# Patient Record
Sex: Female | Born: 1970 | Race: White | Hispanic: No | Marital: Married | Smoking: Former smoker
Health system: Southern US, Community
[De-identification: ages and names within clinical notes are randomized; demographics above are authoritative.]

## PROBLEM LIST (undated history)

## (undated) DIAGNOSIS — Z889 Allergy status to unspecified drugs, medicaments and biological substances status: Secondary | ICD-10-CM

## (undated) DIAGNOSIS — J45909 Unspecified asthma, uncomplicated: Secondary | ICD-10-CM

## (undated) DIAGNOSIS — T8859XA Other complications of anesthesia, initial encounter: Secondary | ICD-10-CM

## (undated) DIAGNOSIS — R87619 Unspecified abnormal cytological findings in specimens from cervix uteri: Secondary | ICD-10-CM

## (undated) HISTORY — PX: TONSILLECTOMY: SUR1361

## (undated) HISTORY — PX: SINUS SURGERY WITH INSTATRAK: SHX5215

## (undated) HISTORY — PX: COLPOSCOPY: SHX161

## (undated) HISTORY — PX: CHOLECYSTECTOMY: SHX55

## (undated) HISTORY — DX: Unspecified abnormal cytological findings in specimens from cervix uteri: R87.619

---

## 2014-06-14 ENCOUNTER — Ambulatory Visit: Payer: Self-pay

## 2014-09-06 ENCOUNTER — Ambulatory Visit: Payer: Self-pay | Admitting: Physician Assistant

## 2014-09-28 DIAGNOSIS — Z803 Family history of malignant neoplasm of breast: Secondary | ICD-10-CM | POA: Insufficient documentation

## 2014-09-28 DIAGNOSIS — J302 Other seasonal allergic rhinitis: Secondary | ICD-10-CM | POA: Insufficient documentation

## 2014-09-28 DIAGNOSIS — J45909 Unspecified asthma, uncomplicated: Secondary | ICD-10-CM | POA: Insufficient documentation

## 2014-10-12 ENCOUNTER — Ambulatory Visit: Payer: Self-pay | Admitting: Family Medicine

## 2014-12-11 ENCOUNTER — Ambulatory Visit: Payer: Self-pay | Admitting: Family Medicine

## 2015-05-02 ENCOUNTER — Ambulatory Visit
Admission: EM | Admit: 2015-05-02 | Discharge: 2015-05-02 | Disposition: A | Discharge status: Home / Self Care | Payer: Commercial Indemnity | Attending: Internal Medicine | Admitting: Internal Medicine

## 2015-05-02 DIAGNOSIS — J321 Chronic frontal sinusitis: Secondary | ICD-10-CM

## 2015-05-02 HISTORY — DX: Unspecified asthma, uncomplicated: J45.909

## 2015-05-02 MED ORDER — AMOXICILLIN-POT CLAVULANATE 875-125 MG PO TABS: 1.0000 | ORAL_TABLET | Freq: Two times a day (BID) | ORAL | Status: DC

## 2015-05-02 NOTE — Discharge Instructions (Signed)

## 2015-05-02 NOTE — ED Notes (Signed)
Started Monday with sinus congestion. + pain and pressure behind eyes. + left ear pain.

## 2015-05-02 NOTE — ED Provider Notes (Signed)
CSN: [id number]     Arrival date & time 05/02/15  1922 History   First MD Initiated Contact with Patient 05/02/15 2000     Chief Complaint  Patient presents with  . Sinusitis   (Consider location/radiation/quality/duration/timing/severity/associated sxs/prior Treatment) HPI   Is a 44 year old female who presents with chronic recurrent sinusitis. She is living in a new house that is currently having great deal of construction around her causing dust which is exacerbated her symptoms. For the last 4 days she had a terrible sinus headache with sinus pressure and drainage. Now her right ear has begun to hurt as well in the care of an allergist provides injection. Recently she had a CT scan which showed a deep infection of her sinuses. From December to February she was under the care of several physiciansfor antibiotic treatment which finally cleared but has since returned. She's not had fever or chills but does have severe postnasal drip and a cough    Past Medical History  Diagnosis Date  . Asthma    Past Surgical History  Procedure Laterality Date  . Sinus surgery with instatrak     History reviewed. No pertinent family history. History  Substance Use Topics  . Smoking status: Former Research scientist (life sciences)  . Smokeless tobacco: Not on file  . Alcohol Use: Yes     Comment: rarely   OB History    No data available     Review of Systems  HENT: Positive for congestion, ear pain, postnasal drip, rhinorrhea, sinus pressure and sore throat.   Respiratory: Positive for cough and shortness of breath.   All other systems reviewed and are negative.   Allergies  Venomil wasp venom and Ether  Home Medications   Prior to Admission medications   Medication Sig Start Date End Date Taking? Authorizing Provider  fluticasone (FLONASE) 50 MCG/ACT nasal spray Place 1 spray into both nostrils daily.   Yes Historical Provider, MD  fluticasone (FLOVENT HFA) 110 MCG/ACT inhaler Inhale 2 puffs into the lungs 2  (two) times daily.   Yes Historical Provider, MD  amoxicillin-clavulanate (AUGMENTIN) 875-125 MG per tablet Take 1 tablet by mouth every 12 (twelve) hours. 05/02/15   Lorin Picket, PA-C   BP 109/84 mmHg  Pulse 99  Temp(Src) 98.2 F (36.8 C) (Oral)  Resp 16  Ht 5\' 8"  (1.727 m)  Wt 320 lb (145.151 kg)  BMI 48.67 kg/m2  SpO2 99%  LMP 04/25/2015 (Approximate) Physical Exam  Constitutional: She is oriented to person, place, and time. She appears well-developed and well-nourished.  HENT:  Head: Normocephalic and atraumatic.  CM show a effusion. There is tenderness to caution over the frontal sinuses more than the maxillary. Pharynx is benign there is no anterior cervical adenopathy present  Eyes: EOM are normal. Pupils are equal, round, and reactive to light. Right eye exhibits no discharge. Left eye exhibits no discharge.  Neck: Normal range of motion. Neck supple.  Pulmonary/Chest: Breath sounds normal. She is in respiratory distress. She has no wheezes. She has no rales. She exhibits no tenderness.  Musculoskeletal: Normal range of motion.  Lymphadenopathy:    She has no cervical adenopathy.  Neurological: She is alert and oriented to person, place, and time. She has normal reflexes.  Skin: Skin is warm and dry.  Psychiatric: She has a normal mood and affect. Her behavior is normal. Judgment and thought content normal.    ED Course  Procedures (including critical care time) Labs Review Labs Reviewed - No data to  display  Imaging Review No results found.   MDM   1. Chronic frontal sinusitis    New Prescriptions   AMOXICILLIN-CLAVULANATE (AUGMENTIN) 875-125 MG PER TABLET    Take 1 tablet by mouth every 12 (twelve) hours.  Plan: 1.  diagnosis reviewed with patient 2. rx as per orders; risks, benefits, potential side effects reviewed with patient 3. Recommend supportive treatment with rest, Netti pot, fluids, flonase 4. F/u prn if symptoms worsen or don't  improve     Lorin Picket, PA-C 05/02/15 2024

## 2015-10-10 ENCOUNTER — Encounter: Payer: Self-pay | Admitting: Emergency Medicine

## 2015-10-10 ENCOUNTER — Ambulatory Visit
Admission: EM | Admit: 2015-10-10 | Discharge: 2015-10-10 | Disposition: A | Discharge status: Home / Self Care | Payer: Managed Care, Other (non HMO) | Attending: Family Medicine | Admitting: Family Medicine

## 2015-10-10 DIAGNOSIS — J329 Chronic sinusitis, unspecified: Secondary | ICD-10-CM | POA: Diagnosis not present

## 2015-10-10 HISTORY — DX: Allergy status to unspecified drugs, medicaments and biological substances: Z88.9

## 2015-10-10 MED ORDER — CEFUROXIME AXETIL 250 MG PO TABS: ORAL_TABLET | ORAL | Status: DC

## 2015-10-10 NOTE — ED Provider Notes (Signed)
CSN: [id number]     Arrival date & time 10/10/15  1003 History   First MD Initiated Contact with Patient 10/10/15 1129     Chief Complaint  Patient presents with  . Recurrent Sinusitis  . Hoarse   (Consider location/radiation/quality/duration/timing/severity/associated sxs/prior Treatment) HPI   This a 44 year old female who has a history of recurring chronic sinus infections usually around this time of year which almost always will trigger an asthma attack. She states that she has been receiving allergy injections more allergist which has helped a great deal. However several days ago she began to have symptoms of the sinus infection that actually got better in one day only to return causing her to have a large amount of greenish mucus production this morning but with no asthma currently. She has been using her Flonase on a daily basis as well as Flovent which seems to have held the symptoms. She's had a history of sinus surgery and prior environmental allergies necessitating the allergies injections since being a toddler. She has not been febrile and is not feverish today and her O2 sat of 99% on room air.  Past Medical History  Diagnosis Date  . Asthma   . Multiple allergies     pets, mold, pollen, dust mites etc.   Past Surgical History  Procedure Laterality Date  . Sinus surgery with instatrak    . Cholecystectomy    . Tonsillectomy    . Cesarean section    . Sinus surgery with instatrak     History reviewed. No pertinent family history. Social History  Substance Use Topics  . Smoking status: Former Research scientist (life sciences)  . Smokeless tobacco: None  . Alcohol Use: Yes     Comment: rarely   OB History    No data available     Review of Systems  Constitutional: Negative for fever, chills, diaphoresis, activity change and fatigue.  HENT: Positive for congestion, postnasal drip, rhinorrhea, sinus pressure, sneezing and voice change.   Respiratory: Positive for cough.    Allergic/Immunologic: Positive for environmental allergies and food allergies.  All other systems reviewed and are negative.   Allergies  Venomil wasp venom; Ether; and Pollen extract  Home Medications   Prior to Admission medications   Medication Sig Start Date End Date Taking? Authorizing Provider  amoxicillin-clavulanate (AUGMENTIN) 875-125 MG per tablet Take 1 tablet by mouth every 12 (twelve) hours. 05/02/15   Lorin Picket, PA-C  cefUROXime (CEFTIN) 250 MG tablet Take one tablet (250) mg BID with food. 10/10/15   Lorin Picket, PA-C  fluticasone (FLONASE) 50 MCG/ACT nasal spray Place 1 spray into both nostrils daily.    Historical Provider, MD  fluticasone (FLOVENT HFA) 110 MCG/ACT inhaler Inhale 2 puffs into the lungs 2 (two) times daily.    Historical Provider, MD   Meds Ordered and Administered this Visit  Medications - No data to display  BP 113/96 mmHg  Pulse 64  Temp(Src) 98.9 F (37.2 C) (Oral)  Resp 14  Ht 5\' 8"  (1.727 m)  Wt 310 lb (140.615 kg)  BMI 47.15 kg/m2  SpO2 99% No data found.   Physical Exam  Constitutional: She is oriented to person, place, and time. She appears well-developed and well-nourished. No distress.  HENT:  Head: Normocephalic and atraumatic.  Right Ear: External ear normal.  Left Ear: External ear normal.  Mouth/Throat: Oropharynx is clear and moist.  Eyes: Pupils are equal, round, and reactive to light.  Neck: Neck supple.  Pulmonary/Chest: Breath sounds  normal. No respiratory distress. She has no wheezes. She has no rales.  Musculoskeletal: Normal range of motion. She exhibits no edema or tenderness.  Lymphadenopathy:    She has no cervical adenopathy.  Neurological: She is alert and oriented to person, place, and time.  Skin: Skin is warm and dry. She is not diaphoretic.  Psychiatric: She has a normal mood and affect. Her behavior is normal. Judgment and thought content normal.  Nursing note and vitals reviewed.   ED  Course  Procedures (including critical care time)  Labs Review Labs Reviewed - No data to display  Imaging Review No results found.   Visual Acuity Review  Right Eye Distance:   Left Eye Distance:   Bilateral Distance:    Right Eye Near:   Left Eye Near:    Bilateral Near:         MDM   1. Chronic sinusitis, unspecified location    Discharge Medication List as of 10/10/2015 11:34 AM    START taking these medications   Details  cefUROXime (CEFTIN) 250 MG tablet Take one tablet (250) mg BID with food., Print      Plan: 1. Diagnosis reviewed with patient 2. rx as per orders; risks, benefits, potential side effects reviewed with patient 3. Recommend supportive treatment with rest,flonase, 4. F/u prn if symptoms worsen or don't improve   Told patient that she likely has a large allergy overlay to her symptoms. I've encouraged her to continue with the allergist to continue with her Flovent and Flonase. I will provide her prescription for Ceftin to use only if this continues to bother her for about another week. Not having any symptoms or signs of asthma today. The Flovent is probably helping a great deal in this regard. She should follow-up with her allergist and her primary care physician if she continues to have her symptoms.  Lorin Picket, PA-C 10/10/15 1144

## 2015-10-10 NOTE — ED Notes (Signed)
Pt reports recurrent sinus infection and has triggered her asthma, hoarse voice.

## 2015-10-10 NOTE — Discharge Instructions (Signed)

## 2016-09-14 ENCOUNTER — Ambulatory Visit
Admission: EM | Admit: 2016-09-14 | Discharge: 2016-09-14 | Disposition: A | Discharge status: Home / Self Care | Payer: Managed Care, Other (non HMO) | Attending: Family Medicine | Admitting: Family Medicine

## 2016-09-14 ENCOUNTER — Encounter: Payer: Self-pay | Admitting: Gynecology

## 2016-09-14 DIAGNOSIS — J0101 Acute recurrent maxillary sinusitis: Secondary | ICD-10-CM

## 2016-09-14 MED ORDER — CEFUROXIME AXETIL 250 MG PO TABS: ORAL_TABLET | ORAL | 0 refills | Status: DC

## 2016-09-14 NOTE — ED Triage Notes (Signed)
Patient c/o sinus problem x 4 days 

## 2016-09-14 NOTE — ED Provider Notes (Signed)
CSN: [id number]     Arrival date & time 09/14/16  1123 History   None    Chief Complaint  Patient presents with  . Facial Pain   (Consider location/radiation/quality/duration/timing/severity/associated sxs/prior Treatment) HPI  45 YO female presents with recurrent sinusitis. Here last year for same. Now with facial pain drainage, And exacerbation of asthma. No fever or chills. Receives allergy shots weekly.   Denies feer or chills.     Past Medical History:  Diagnosis Date  . Asthma   . Multiple allergies    pets, mold, pollen, dust mites etc.   Past Surgical History:  Procedure Laterality Date  . CESAREAN SECTION    . CHOLECYSTECTOMY    . SINUS SURGERY WITH INSTATRAK    . SINUS SURGERY WITH INSTATRAK    . TONSILLECTOMY     No family history on file. Social History  Substance Use Topics  . Smoking status: Former Research scientist (life sciences)  . Smokeless tobacco: Never Used  . Alcohol use Yes     Comment: rarely   OB History    No data available     Review of Systems  Constitutional: Negative for chills, fatigue and fever.  HENT: Positive for congestion, postnasal drip, rhinorrhea and sinus pressure.   Respiratory: Positive for cough.   All other systems reviewed and are negative.   Allergies  Venomil wasp venom [wasp venom]; Ether; and Pollen extract  Home Medications   Prior to Admission medications   Medication Sig Start Date End Date Taking? Authorizing Provider  amoxicillin-clavulanate (AUGMENTIN) 875-125 MG per tablet Take 1 tablet by mouth every 12 (twelve) hours. 05/02/15   Lorin Picket, PA-C  cefUROXime (CEFTIN) 250 MG tablet Take one tablet (250) mg BID with food. 09/14/16   Lorin Picket, PA-C  fluticasone (FLONASE) 50 MCG/ACT nasal spray Place 1 spray into both nostrils daily.    Historical Provider, MD  fluticasone (FLOVENT HFA) 110 MCG/ACT inhaler Inhale 2 puffs into the lungs 2 (two) times daily.    Historical Provider, MD   Meds Ordered and Administered  this Visit  Medications - No data to display  BP (!) 107/6 (BP Location: Left Arm)   Pulse 80   Temp 98.4 F (36.9 C) (Oral)   Resp 18   Ht 6\' 9"  (2.057 m)   LMP 08/24/2016   SpO2 100%  No data found.   Physical Exam  Constitutional: She is oriented to person, place, and time. She appears well-developed and well-nourished. No distress.  HENT:  Head: Normocephalic and atraumatic.  Right Ear: External ear normal.  Left Ear: External ear normal.  Mouth/Throat: No oropharyngeal exudate.  Turbinates erythematous swollen. Tenderness to percussion over maxillary sinus.  Eyes: EOM are normal. Pupils are equal, round, and reactive to light.  Neck: Normal range of motion. Neck supple.  Pulmonary/Chest: Effort normal and breath sounds normal. No respiratory distress. She has no wheezes. She has no rales.  Musculoskeletal: Normal range of motion.  Neurological: She is alert and oriented to person, place, and time.  Skin: Skin is warm and dry. She is not diaphoretic.  Psychiatric: She has a normal mood and affect. Her behavior is normal. Judgment and thought content normal.  Nursing note and vitals reviewed.   Urgent Care Course   Clinical Course    Procedures (including critical care time)  Labs Review Labs Reviewed - No data to display  Imaging Review No results found.   Visual Acuity Review  Right Eye Distance:   Left  Eye Distance:   Bilateral Distance:    Right Eye Near:   Left Eye Near:    Bilateral Near:         MDM   1. Acute recurrent maxillary sinusitis    Discharge Medication List as of 09/14/2016  1:21 PM    Plan: 1. Test/x-ray results and diagnosis reviewed with patient 2. rx as per orders; risks, benefits, potential side effects reviewed with patient 3. Recommend supportive treatment with Flonase and sinus rinse. Will give Ceftin for use in 1 week if not improving. F/U with PCP if not improving. 4. F/u prn if symptoms worsen or don't improve      Lorin Picket, PA-C 09/14/16 1332

## 2017-01-08 ENCOUNTER — Encounter: Payer: Self-pay | Admitting: *Deleted

## 2017-01-08 ENCOUNTER — Ambulatory Visit
Admission: EM | Admit: 2017-01-08 | Discharge: 2017-01-08 | Disposition: A | Discharge status: Home / Self Care | Payer: Managed Care, Other (non HMO) | Attending: Emergency Medicine | Admitting: Emergency Medicine

## 2017-01-08 DIAGNOSIS — J0100 Acute maxillary sinusitis, unspecified: Secondary | ICD-10-CM

## 2017-01-08 DIAGNOSIS — J4521 Mild intermittent asthma with (acute) exacerbation: Secondary | ICD-10-CM

## 2017-01-08 MED ORDER — DEXAMETHASONE 4 MG PO TABS: ORAL_TABLET | ORAL | 0 refills | Status: DC

## 2017-01-08 MED ORDER — AMOXICILLIN-POT CLAVULANATE 875-125 MG PO TABS: 1.0000 | ORAL_TABLET | Freq: Two times a day (BID) | ORAL | 0 refills | Status: DC

## 2017-01-08 MED ORDER — ALBUTEROL SULFATE HFA 108 (90 BASE) MCG/ACT IN AERS: 1.0000 | INHALATION_SPRAY | Freq: Four times a day (QID) | RESPIRATORY_TRACT | 0 refills | Status: DC | PRN

## 2017-01-08 MED ORDER — AEROCHAMBER PLUS MISC: 2 refills | Status: DC

## 2017-01-08 NOTE — ED Triage Notes (Signed)
Patient started having symptoms of nasal congestion, and cough 2 days. Patient reports missing her allergy shots for the last 2 weeks.

## 2017-01-08 NOTE — ED Provider Notes (Signed)
HPI  SUBJECTIVE:  Brittany Buchanan is a 46 y.o. female who presents with rhinorrhea, postnasal drip and clear nasal congestion for the past 2 days after being exposed to mold/musty homes which is a known trigger for her allergies, asthma and sinuses. She reports a cough productive of yellowish mucus starting yesterday, wheezing, shortness of breath. She reports sinus pain and pressure and that her ears feel are popping and "feel full. She denies fevers, dental pain, facial swelling. No chest pain. No ear pain. She tried Flonase, ibuprofen. She has been eating her rescue albuterol inhaler much more frequently than usual. States that she is waking up at night coughing She has also tried Sudafed and saline nasal irrigation. No other aggravating or alleviating factors. She is here because her sinusitis always triggers asthma flares. She has past medical history of sinusitis status post sinus surgery. She states her symptoms are identical to previous episodes of sinusitis. She also has a history allergy-induced asthma. No history of diabetes, hypertension. LMP: Now. Denies possibility of being pregnant. [id number], Titus Mould, DO   Past Medical History:  Diagnosis Date  . Asthma   . Multiple allergies    pets, mold, pollen, dust mites etc.    Past Surgical History:  Procedure Laterality Date  . CESAREAN SECTION    . CHOLECYSTECTOMY    . SINUS SURGERY WITH INSTATRAK    . SINUS SURGERY WITH INSTATRAK    . TONSILLECTOMY      History reviewed. No pertinent family history.  Social History  Substance Use Topics  . Smoking status: Former Research scientist (life sciences)  . Smokeless tobacco: Never Used  . Alcohol use Yes     Comment: rarely    No current facility-administered medications for this encounter.   Current Outpatient Prescriptions:  .  fluticasone (FLONASE) 50 MCG/ACT nasal spray, Place 1 spray into both nostrils daily., Disp: , Rfl:  .  fluticasone (FLOVENT HFA) 110 MCG/ACT inhaler, Inhale 2  puffs into the lungs 2 (two) times daily., Disp: , Rfl:  .  albuterol (PROVENTIL HFA;VENTOLIN HFA) 108 (90 Base) MCG/ACT inhaler, Inhale 1-2 puffs into the lungs every 6 (six) hours as needed for wheezing or shortness of breath., Disp: 1 Inhaler, Rfl: 0 .  amoxicillin-clavulanate (AUGMENTIN) 875-125 MG tablet, Take 1 tablet by mouth 2 (two) times daily. X 7 days, Disp: 14 tablet, Rfl: 0 .  dexamethasone (DECADRON) 4 MG tablet, 4 tablets (16 mg) po at once on day 1 and 4 tabs (16 mg) po at once on day 2, Disp: 8 tablet, Rfl: 0 .  Spacer/Aero-Holding Chambers (AEROCHAMBER PLUS) inhaler, Use as instructed, Disp: 1 each, Rfl: 2  Allergies  Allergen Reactions  . Venomil Wasp Venom [Wasp Venom] Anaphylaxis  . Ether Other (See Comments)  . Pollen Extract     Environmental allergies       ROS  As noted in HPI.   Physical Exam  BP (!) 150/97 (BP Location: Right Arm)   Pulse 84   Temp 98.4 F (36.9 C) (Oral)   Resp 16   Ht 5\' 8"  (1.727 m)   Wt (!) 319 lb (144.7 kg)   LMP 01/08/2017   SpO2 100%   BMI 48.50 kg/m   Constitutional: Well developed, well nourished, no acute distress Eyes:  EOMI, conjunctiva normal bilaterally HENT: Normocephalic, atraumatic,mucus membranes moist. Clear rhinorrhea, normal turbinates. Positive frontal sinus tenderness. No maxillary sinus tenderness. Unable to fully visualize oropharynx. Respiratory: Normal inspiratory effort, occasional wheezing. No rales, rhonchi. No chest wall  tenderness  Cardiovascular: Normal rate regular rhythm no murmurs rubs gallops GI: nondistended skin: No rash, skin intact Musculoskeletal: no deformities Neurologic: Alert & oriented x 3, no focal neuro deficits Psychiatric: Speech and behavior appropriate   ED Course   Medications - No data to display  No orders of the defined types were placed in this encounter.   No results found for this or any previous visit (from the past 24 hour(s)). No results found.  ED  Clinical Impression  Acute maxillary sinusitis, recurrence not specified  Mild intermittent asthma with exacerbation   ED Assessment/Plan  Presentation most consistent with an asthma exacerbation/allergic sinusitis that was caused by a known trigger. States that she is needing her albuterol much more frequently and it is not working. We'll treat as if this is an asthma exacerbation with dexamethasone 16 mg by mouth daily for 2 days, refill patient's albuterol with spacer, 2 puffs every 4-6 hours as needed for coughing, wheezing, shortness of breath. states that she does not need Flovent. Pt to start doing regular saline nasal irrigation. She is to start Allegra, Claritin or Zyrtec-D. We'll also give her a wait-and-see prescription of Augmentin if she does not improve with this.  Discussed MDM, plan and followup with patient.  Patient agrees with plan.   Meds ordered this encounter  Medications  . dexamethasone (DECADRON) 4 MG tablet    Sig: 4 tablets (16 mg) po at once on day 1 and 4 tabs (16 mg) po at once on day 2    Dispense:  8 tablet    Refill:  0  . Spacer/Aero-Holding Chambers (AEROCHAMBER PLUS) inhaler    Sig: Use as instructed    Dispense:  1 each    Refill:  2  . albuterol (PROVENTIL HFA;VENTOLIN HFA) 108 (90 Base) MCG/ACT inhaler    Sig: Inhale 1-2 puffs into the lungs every 6 (six) hours as needed for wheezing or shortness of breath.    Dispense:  1 Inhaler    Refill:  0  . amoxicillin-clavulanate (AUGMENTIN) 875-125 MG tablet    Sig: Take 1 tablet by mouth 2 (two) times daily. X 7 days    Dispense:  14 tablet    Refill:  0    *This clinic note was created using [employer]. Therefore, there may be occasional mistakes despite careful proofreading.  ?   Melynda Ripple, MD 01/08/17 2121

## 2017-01-08 NOTE — Discharge Instructions (Signed)
2 puffs from your albuterol inhaler every 4-6 hours as needed for coughing, wheezing, shortness of breath. Start doing regular saline nasal irrigation. Start an antihistamine/decongestant combination of your choice such as Claritin-D, Allegra-D or Zyrtec-D. Stop the Sudafed if you do start doing this. If you're not getting better in 48 hours, then start the Augmentin.

## 2017-04-18 ENCOUNTER — Ambulatory Visit
Admission: EM | Admit: 2017-04-18 | Discharge: 2017-04-18 | Disposition: A | Discharge status: Home / Self Care | Payer: Managed Care, Other (non HMO) | Attending: Family Medicine | Admitting: Family Medicine

## 2017-04-18 ENCOUNTER — Encounter: Payer: Self-pay | Admitting: Emergency Medicine

## 2017-04-18 DIAGNOSIS — J309 Allergic rhinitis, unspecified: Secondary | ICD-10-CM

## 2017-04-18 DIAGNOSIS — J45901 Unspecified asthma with (acute) exacerbation: Secondary | ICD-10-CM

## 2017-04-18 DIAGNOSIS — J019 Acute sinusitis, unspecified: Secondary | ICD-10-CM | POA: Diagnosis not present

## 2017-04-18 MED ORDER — AMOXICILLIN-POT CLAVULANATE 875-125 MG PO TABS: 1.0000 | ORAL_TABLET | Freq: Two times a day (BID) | ORAL | 0 refills | Status: DC

## 2017-04-18 MED ORDER — PREDNISONE 10 MG PO TABS: ORAL_TABLET | ORAL | 0 refills | Status: DC

## 2017-04-18 NOTE — ED Provider Notes (Signed)
[facility] ____________________________________________  Time seen: Approximately 9:37 AM  I have reviewed the triage vital signs and the nursing notes.   HISTORY  Chief Complaint Sinus Problem and Cough   HPI Brittany Buchanan is a 46 y.o. female  presenting for evaluation of her nose, nasal congestion, cough and wheezing. Patient reports that she has a chronic history of asthma that is most recently exacerbated by seasonal and environmental allergies. 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 Patient states that she has continued with sinus congestion and sinus pressure. Denies fevers. Reports has been intermittently using home albuterol inhaler with some improvement. Reports last use home albuterol this morning prior to arrival. Denies known sick contacts. Reports continues to eat and drink well. Reports having a lot of nasal congestion and nasal drainage. States occasional nosebleed from blowing her nose, denies any other nosebleeds. Denies any other abnormal bruising or bleeding. Denies chest pain or current shortness of breath. Reports also symptoms unresolved with home Flonase, albuterol and allergy medicines.  Denies chest pain, shortness of breath, abdominal pain, dysuria, extremity pain, extremity swelling or rash. Denies recent sickness. Denies recent antibiotic use.   Gearldine Shown, DO: PCP Patient's last menstrual period was 03/28/2017 (approximate). Denies pregnancy   Past Medical History:  Diagnosis Date  . Asthma   . Multiple allergies    pets, mold, pollen, dust mites etc.    There are no active problems to display for this patient.   Past Surgical History:  Procedure Laterality Date  . CESAREAN SECTION    . CHOLECYSTECTOMY    . SINUS SURGERY WITH INSTATRAK    . SINUS SURGERY WITH INSTATRAK    . TONSILLECTOMY       No current  facility-administered medications for this encounter.   Current Outpatient Prescriptions:  .  albuterol (PROVENTIL HFA;VENTOLIN HFA) 108 (90 Base) MCG/ACT inhaler, Inhale 1-2 puffs into the lungs every 6 (six) hours as needed for wheezing or shortness of breath., Disp: 1 Inhaler, Rfl: 0 .  amoxicillin-clavulanate (AUGMENTIN) 875-125 MG tablet, Take 1 tablet by mouth every 12 (twelve) hours., Disp: 20 tablet, Rfl: 0 .  fluticasone (FLONASE) 50 MCG/ACT nasal spray, Place 1 spray into both nostrils daily., Disp: , Rfl:  .  fluticasone (FLOVENT HFA) 110 MCG/ACT inhaler, Inhale 2 puffs into the lungs 2 (two) times daily., Disp: , Rfl:  .  predniSONE (DELTASONE) 10 MG tablet, Start 60 mg po day one, then 50 mg po day two, taper by 10 mg daily until complete., Disp: 21 tablet, Rfl: 0 .  Spacer/Aero-Holding Chambers (AEROCHAMBER PLUS) inhaler, Use as instructed, Disp: 1 each, Rfl: 2  Allergies Venomil wasp venom [wasp venom]; Ether; and Pollen extract  History reviewed. No pertinent family history.  Social History Social History  Substance Use Topics  . Smoking status: Former Research scientist (life sciences)  . Smokeless tobacco: Never Used  . Alcohol use Yes     Comment: rarely    Review of Systems Constitutional: No fever/chills Eyes: No visual changes. ENT: No sore throat. Cardiovascular: Denies chest pain. Respiratory: Denies shortness of breath. Gastrointestinal: No abdominal pain.  No nausea, no vomiting.  No diarrhea.  No constipation. Genitourinary: Negative for dysuria. Musculoskeletal: Negative for back pain. Skin: Negative for rash.  ____________________________________________   PHYSICAL EXAM:  VITAL SIGNS: ED Triage Vitals  Enc Vitals Group     BP 04/18/17 0858 131/77  Pulse Rate 04/18/17 0858 85     Resp 04/18/17 0858 16     Temp 04/18/17 0858 98.5 F (36.9 C)     Temp Source 04/18/17 0858 Oral     SpO2 04/18/17 0858 99 %     Weight 04/18/17 0855 (!) 320 lb (145.2 kg)     Height  04/18/17 0855 5\' 8"  (1.727 m)     Head Circumference --      Peak Flow --      Pain Score 04/18/17 0856 4     Pain Loc --      Pain Edu? --      Excl. in [place]? --     Constitutional: Alert and oriented. Well appearing and in no acute distress. Eyes: Conjunctivae are normal. PERRL. EOMI. Head: Atraumatic.Mild tenderness to palpation bilateral frontal and maxillary sinuses. No swelling. No erythema.   Ears: no erythema, normal TMs bilaterally.   Nose: nasal congestion with bilateral nasal turbinate erythema and edema. No active bleeding, no dry blood noted bilaterally.  Mouth/Throat: Mucous membranes are moist.  Oropharynx non-erythematous.No tonsillar swelling or exudate.  Neck: No stridor.  No cervical spine tenderness to palpation. Hematological/Lymphatic/Immunilogical: No cervical lymphadenopathy. Cardiovascular: Normal rate, regular rhythm. Grossly normal heart sounds.  Good peripheral circulation. Respiratory: Normal respiratory effort.  No retractions. Good air movement. Mild scattered inspiratory wheezes. Good air movement. Occasional dry cough noted with mild bronchospasm. Musculoskeletal: No lower extremity edema noted bilaterally. Ambulatory with steady gait. Neurologic:  Normal speech and language. No gait instability. Skin:  Skin is warm, dry and intact. No rash noted. Psychiatric: Mood and affect are normal. Speech and behavior are normal.   ___________________________________________   LABS (all labs ordered are listed, but only abnormal results are displayed)  Labs Reviewed - No data to display   PROCEDURES Procedures    INITIAL IMPRESSION / ASSESSMENT AND PLAN / ED COURSE  Pertinent labs & imaging results that were available during my care of the patient were reviewed by me and considered in my medical decision making (see chart for details).  Very well-appearing patient. No acute distress. Suspect asthma exacerbation secondary to environmental allergies with  allergic sinusitis. Encouraged supportive care, continue home medications, rest, fluids. Avoid triggers. Will treat patient with prednisone taper and continue home albuterol inhaler for wheezing. Also discussed the patient will Rx Augmentin for symptoms that they continue another 2 days. Discussed with patient no clear indication for clear use at this time. Encourage rest, fluids and supportive care. Encouraged follow-up for any worsening or change of concerns.Discussed indication, risks and benefits of medications with patient.  Discussed follow up with Primary care physician this week. Discussed follow up and return parameters including no resolution or any worsening concerns. Patient verbalized understanding and agreed to plan.   ____________________________________________   FINAL CLINICAL IMPRESSION(S) / ED DIAGNOSES  Final diagnoses:  Exacerbation of asthma, unspecified asthma severity, unspecified whether persistent  Allergic sinusitis     Discharge Medication List as of 04/18/2017  9:14 AM    START taking these medications   Details  amoxicillin-clavulanate (AUGMENTIN) 875-125 MG tablet Take 1 tablet by mouth every 12 (twelve) hours., Starting Sat 04/18/2017, Normal    predniSONE (DELTASONE) 10 MG tablet Start 60 mg po day one, then 50 mg po day two, taper by 10 mg daily until complete., Normal        Note: This dictation was prepared with Dragon dictation along with smaller phrase technology. Any transcriptional errors that result from  this process are unintentional.         Marylene Land, NP 04/18/17 [place], [place], NP 04/18/17 [phone]

## 2017-04-18 NOTE — Discharge Instructions (Signed)
Take medication as prescribed. Rest. Drink plenty of fluids.  ° °Follow up with your primary care physician this week as needed. Return to Urgent care for new or worsening concerns.  ° °

## 2017-04-18 NOTE — ED Triage Notes (Signed)
Patient c/o sinus congestion and pressure, runny nose, and cough that started Thursday.

## 2017-06-11 ENCOUNTER — Encounter: Payer: Self-pay | Admitting: *Deleted

## 2017-06-11 ENCOUNTER — Ambulatory Visit
Admission: EM | Admit: 2017-06-11 | Discharge: 2017-06-11 | Disposition: A | Discharge status: Home / Self Care | Payer: Managed Care, Other (non HMO) | Attending: Family Medicine | Admitting: Family Medicine

## 2017-06-11 DIAGNOSIS — R05 Cough: Secondary | ICD-10-CM

## 2017-06-11 DIAGNOSIS — R0981 Nasal congestion: Secondary | ICD-10-CM | POA: Diagnosis not present

## 2017-06-11 DIAGNOSIS — Z77120 Contact with and (suspected) exposure to mold (toxic): Secondary | ICD-10-CM | POA: Diagnosis not present

## 2017-06-11 DIAGNOSIS — R51 Headache: Secondary | ICD-10-CM | POA: Diagnosis not present

## 2017-06-11 MED ORDER — BENZONATATE 200 MG PO CAPS: ORAL_CAPSULE | ORAL | 0 refills | Status: DC

## 2017-06-11 MED ORDER — PREDNISONE 20 MG PO TABS: ORAL_TABLET | ORAL | 0 refills | Status: DC

## 2017-06-11 NOTE — Discharge Instructions (Signed)
Take Zantac 150 mg twice daily for 4 days. Use Zyrtec daily for 4 days. May use Benadryl 50 mg at bedtime. Continue using Flonase 2 sprays each nostril once daily

## 2017-06-11 NOTE — ED Triage Notes (Signed)
Head congestion, headache, runny nose, x3-5 days. Hx of sinus infections.

## 2017-06-11 NOTE — ED Provider Notes (Signed)
CSN: [id number]     Arrival date & time 06/11/17  0907 History   First MD Initiated Contact with Patient 06/11/17 [phone]     Chief Complaint  Patient presents with  . Headache  . Nasal Congestion   (Consider location/radiation/quality/duration/timing/severity/associated sxs/prior Treatment) HPI  This a 46 year old female who presents with 3-5 days of head congestion headache and runny nose. She has a history of sinus infections in the past is undergone sinus surgery as well. She had Augmentin left over from previous sinus infections and started taking this around 3-4 days ago but has not noticed any improvement. 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 She has not had any fever or chills. Has had a mild nonproductive cough. She is using Flonase on a daily basis.        Past Medical History:  Diagnosis Date  . Asthma   . Multiple allergies    pets, mold, pollen, dust mites etc.   Past Surgical History:  Procedure Laterality Date  . CESAREAN SECTION    . CHOLECYSTECTOMY    . SINUS SURGERY WITH INSTATRAK    . SINUS SURGERY WITH INSTATRAK    . TONSILLECTOMY     History reviewed. No pertinent family history. Social History  Substance Use Topics  . Smoking status: Former Research scientist (life sciences)  . Smokeless tobacco: Never Used  . Alcohol use Yes     Comment: rarely   OB History    No data available     Review of Systems  Constitutional: Positive for activity change. Negative for chills, diaphoresis, fatigue and fever.  HENT: Positive for congestion, postnasal drip, rhinorrhea, sinus pain and sinus pressure.   Respiratory: Positive for cough. Negative for shortness of breath and wheezing.   All other systems reviewed and are negative.   Allergies  Venomil wasp venom [wasp venom]; Ether; and Pollen extract  Home Medications   Prior to Admission medications   Medication Sig Start Date End Date Taking? Authorizing Provider   albuterol (PROVENTIL HFA;VENTOLIN HFA) 108 (90 Base) MCG/ACT inhaler Inhale 1-2 puffs into the lungs every 6 (six) hours as needed for wheezing or shortness of breath. 01/08/17  Yes Melynda Ripple, MD  amoxicillin-clavulanate (AUGMENTIN) 875-125 MG tablet Take 1 tablet by mouth every 12 (twelve) hours. 04/18/17  Yes Marylene Land, NP  fluticasone (FLONASE) 50 MCG/ACT nasal spray Place 1 spray into both nostrils daily.   Yes [provider]  fluticasone (FLOVENT HFA) 110 MCG/ACT inhaler Inhale 2 puffs into the lungs 2 (two) times daily.   Yes [provider]  Spacer/Aero-Holding Chambers (AEROCHAMBER PLUS) inhaler Use as instructed 01/08/17  Yes Melynda Ripple, MD  benzonatate (TESSALON) 200 MG capsule Take one cap TID PRN cough 06/11/17   Lorin Picket, PA-C  predniSONE (DELTASONE) 20 MG tablet Take 2 tablets (40 mg) daily by mouth 06/11/17   Lorin Picket, PA-C   Meds Ordered and Administered this Visit  Medications - No data to display  BP 127/72 (BP Location: Left Arm)   Pulse 76   Temp 98.9 F (37.2 C) (Oral)   Resp 16   Ht 5\' 8"  (1.727 m)   Wt (!) 319 lb (144.7 kg)   SpO2 99%   BMI 48.50 kg/m  No data found.   Physical Exam  Constitutional: She is oriented to person, place, and time. She appears well-developed and well-nourished. No distress.  HENT:  Head: Normocephalic and atraumatic.  Right Ear: External ear normal.  Left Ear: External ear normal.  Nose: Nose normal.  Mouth/Throat: Oropharynx is clear and moist. No oropharyngeal exudate.  Eyes: Pupils are equal, round, and reactive to light. Right eye exhibits no discharge. Left eye exhibits no discharge.  Neck: Normal range of motion.  Pulmonary/Chest: Effort normal and breath sounds normal.  Musculoskeletal: Normal range of motion.  Lymphadenopathy:    She has no cervical adenopathy.  Neurological: She is alert and oriented to person, place, and time.  Skin: Skin is warm and dry. She is  not diaphoretic.  Psychiatric: She has a normal mood and affect. Her behavior is normal. Judgment and thought content normal.  Nursing note and vitals reviewed.   Urgent Care Course     Procedures (including critical care time)  Labs Review Labs Reviewed - No data to display  Imaging Review No results found.   Visual Acuity Review  Right Eye Distance:   Left Eye Distance:   Bilateral Distance:    Right Eye Near:   Left Eye Near:    Bilateral Near:         MDM   1. Suspected exposure to mold    Discharge Medication List as of 06/11/2017  9:56 AM    START taking these medications   Details  benzonatate (TESSALON) 200 MG capsule Take one cap TID PRN cough, Normal      Plan: 1. Test/x-ray results and diagnosis reviewed with patient 2. rx as per orders; risks, benefits, potential side effects reviewed with patient 3. Recommend supportive treatment with Continued use of her Flonase. Because of her having a likely allergic reaction to the musty moldy houses that she was showing start her on some prednisone. Have also recommended the use of Zantac and Zyrtec for approximately 4 days. She should use Benadryl at night if necessary. Improving she should follow-up with her primary care physician. I told her that it's unlikely that she has a sinus infection since the Augmentin has not helped in 3-4 days of taking it. Is more likely she has an allergic reaction. 4. F/u prn if symptoms worsen or don't improve     Lorin Picket, PA-C 06/11/17 1044

## 2019-09-26 ENCOUNTER — Other Ambulatory Visit: Payer: Self-pay | Admitting: Medical Oncology

## 2019-09-26 DIAGNOSIS — Z1231 Encounter for screening mammogram for malignant neoplasm of breast: Secondary | ICD-10-CM

## 2019-10-06 ENCOUNTER — Ambulatory Visit: Payer: Self-pay | Admitting: Advanced Practice Midwife

## 2019-10-13 ENCOUNTER — Encounter: Payer: Self-pay | Admitting: Advanced Practice Midwife

## 2019-10-13 ENCOUNTER — Other Ambulatory Visit: Payer: Self-pay

## 2019-10-13 ENCOUNTER — Ambulatory Visit (INDEPENDENT_AMBULATORY_CARE_PROVIDER_SITE_OTHER): Payer: Managed Care, Other (non HMO) | Admitting: Advanced Practice Midwife

## 2019-10-13 VITALS — BP 126/76 | HR 72 | Ht 68.0 in | Wt 337.0 lb

## 2019-10-13 DIAGNOSIS — Z124 Encounter for screening for malignant neoplasm of cervix: Secondary | ICD-10-CM

## 2019-10-13 DIAGNOSIS — Z1211 Encounter for screening for malignant neoplasm of colon: Secondary | ICD-10-CM

## 2019-10-13 DIAGNOSIS — Z01419 Encounter for gynecological examination (general) (routine) without abnormal findings: Secondary | ICD-10-CM

## 2019-10-13 NOTE — Patient Instructions (Signed)
Menopause Menopause is the normal time of life when menstrual periods stop completely. It is usually confirmed by 12 months without a menstrual period. The transition to menopause (perimenopause) most often happens between the ages of 45 and 55. During perimenopause, hormone levels change in your body, which can cause symptoms and affect your health. Menopause may increase your risk for:  Loss of bone (osteoporosis), which causes bone breaks (fractures).  Depression.  Hardening and narrowing of the arteries (atherosclerosis), which can cause heart attacks and strokes. What are the causes? This condition is usually caused by a natural change in hormone levels that happens as you get older. The condition may also be caused by surgery to remove both ovaries (bilateral oophorectomy). What increases the risk? This condition is more likely to start at an earlier age if you have certain medical conditions or treatments, including:  A tumor of the pituitary gland in the brain.  A disease that affects the ovaries and hormone production.  Radiation treatment for cancer.  Certain cancer treatments, such as chemotherapy or hormone (anti-estrogen) therapy.  Heavy smoking and excessive alcohol use.  Family history of early menopause. This condition is also more likely to develop earlier in women who are very thin. What are the signs or symptoms? Symptoms of this condition include:  Hot flashes.  Irregular menstrual periods.  Night sweats.  Changes in feelings about sex. This could be a decrease in sex drive or an increased comfort around your sexuality.  Vaginal dryness and thinning of the vaginal walls. This may cause painful intercourse.  Dryness of the skin and development of wrinkles.  Headaches.  Problems sleeping (insomnia).  Mood swings or irritability.  Memory problems.  Weight gain.  Hair growth on the face and chest.  Bladder infections or problems with urinating. How  is this diagnosed? This condition is diagnosed based on your medical history, a physical exam, your age, your menstrual history, and your symptoms. Hormone tests may also be done. How is this treated? In some cases, no treatment is needed. You and your health care provider should make a decision together about whether treatment is necessary. Treatment will be based on your individual condition and preferences. Treatment for this condition focuses on managing symptoms. Treatment may include:  Menopausal hormone therapy (MHT).  Medicines to treat specific symptoms or complications.  Acupuncture.  Vitamin or herbal supplements. Before starting treatment, make sure to let your health care provider know if you have a personal or family history of:  Heart disease.  Breast cancer.  Blood clots.  Diabetes.  Osteoporosis. Follow these instructions at home: Lifestyle  Do not use any products that contain nicotine or tobacco, such as cigarettes and e-cigarettes. If you need help quitting, ask your health care provider.  Get at least 30 minutes of physical activity on 5 or more days each week.  Avoid alcoholic and caffeinated beverages, as well as spicy foods. This may help prevent hot flashes.  Get 7-8 hours of sleep each night.  If you have hot flashes, try: ? Dressing in layers. ? Avoiding things that may trigger hot flashes, such as spicy food, warm places, or stress. ? Taking slow, deep breaths when a hot flash starts. ? Keeping a fan in your home and office.  Find ways to manage stress, such as deep breathing, meditation, or journaling.  Consider going to group therapy with other women who are having menopause symptoms. Ask your health care provider about recommended group therapy meetings. Eating and   drinking  Eat a healthy, balanced diet that contains whole grains, lean protein, low-fat dairy, and plenty of fruits and vegetables.  Your health care provider may recommend  adding more soy to your diet. Foods that contain soy include tofu, tempeh, and soy milk.  Eat plenty of foods that contain calcium and vitamin D for bone health. Items that are rich in calcium include low-fat milk, yogurt, beans, almonds, sardines, broccoli, and kale. Medicines  Take over-the-counter and prescription medicines only as told by your health care provider.  Talk with your health care provider before starting any herbal supplements. If prescribed, take vitamins and supplements as told by your health care provider. These may include: ? Calcium. Women age 51 and older should get 1,200 mg (milligrams) of calcium every day. ? Vitamin D. Women need 600-800 International Units of vitamin D each day. ? Vitamins B12 and B6. Aim for 50 micrograms of B12 and 1.5 mg of B6 each day. General instructions  Keep track of your menstrual periods, including: ? When they occur. ? How heavy they are and how long they last. ? How much time passes between periods.  Keep track of your symptoms, noting when they start, how often you have them, and how long they last.  Use vaginal lubricants or moisturizers to help with vaginal dryness and improve comfort during sex.  Keep all follow-up visits as told by your health care provider. This is important. This includes any group therapy or counseling. Contact a health care provider if:  You are still having menstrual periods after age 55.  You have pain during sex.  You have not had a period for 12 months and you develop vaginal bleeding. Get help right away if:  You have: ? Severe depression. ? Excessive vaginal bleeding. ? Pain when you urinate. ? A fast or irregular heart beat (palpitations). ? Severe headaches. ? Abdomen (abdominal) pain or severe indigestion.  You fell and you think you have a broken bone.  You develop leg or chest pain.  You develop vision problems.  You feel a lump in your breast. Summary  Menopause is the normal  time of life when menstrual periods stop completely. It is usually confirmed by 12 months without a menstrual period.  The transition to menopause (perimenopause) most often happens between the ages of 45 and 55.  Symptoms can be managed through medicines, lifestyle changes, and complementary therapies such as acupuncture.  Eat a balanced diet that is rich in nutrients to promote bone health and heart health and to manage symptoms during menopause. This information is not intended to replace advice given to you by your health care provider. Make sure you discuss any questions you have with your health care provider. Document Released: 01/31/2004 Document Revised: 10/23/2017 Document Reviewed: 12/13/2016 [employer]vier Patient Education  2020 [employer]  

## 2019-10-13 NOTE — Progress Notes (Signed)
Gynecology Annual Exam  PCP: Gearldine Shown, DO  Chief Complaint:  Chief Complaint  Patient presents with  . Gynecologic Exam    No cycle since May, S&S of starting and nothing, then had one in October    History of Present Illness:Patient is a 48 y.o. G2P0011 presents for annual exam. The patient has complaint today of change in her cycle. Her last regular period was in either April of May (around the same time that she turned 7). She then had some symptoms of a period starting but never had the period until having a normal period in October. We discussed the normal changes leading up to Menopause and recommendations for healthy lifestyle. Noticing these changes reminded her that she had not had an annual Gyn exam in a while. Her last visit was 3 or 4 years ago.   She admits some stressors related to Covid and being on devices for longer periods. She does get out to walk about 2 miles daily and some hiking on weekends. Her diet is heavy on carbohydrates. She drinks water and also coffee and diet soda. She denies adequate sleep. She denies depression and anxiety. She admits some frustration regarding her husband's anxiety and depression.   She requests PAP smear today. She has been considering having a colonoscopy and accepts GI referral. She has a mammogram scheduled per PCP.   LMP: Patient's last menstrual period was 09/19/2019.  Intermenstrual Bleeding: no Postcoital Bleeding: no Dysmenorrhea: no  The patient is sexually active. She denies dyspareunia.  The patient does perform self breast exams.  There is possibly notable family history of breast or ovarian cancer in her family. Her paternal aunt had breast cancer diagnosed possibly in her 65s or 27s. The patient will find out and let us know if she wants genetic screening based on a significant history.   The patient wears seatbelts: yes.     The patient denies current symptoms of depression.     Review of  Systems: Review of Systems  Constitutional: Negative.   HENT: Negative.   Eyes: Negative.   Respiratory: Negative.   Cardiovascular: Negative.   Gastrointestinal: Negative.   Genitourinary:       Irregular menstrual cycles  Musculoskeletal: Negative.   Skin: Negative.   Neurological: Negative.   Endo/Heme/Allergies: Negative.   Psychiatric/Behavioral: Negative.     Past Medical History:  Past Medical History:  Diagnosis Date  . Abnormal Pap smear of cervix    Age 82  . Asthma   . Multiple allergies    pets, mold, pollen, dust mites etc.    Past Surgical History:  Past Surgical History:  Procedure Laterality Date  . CESAREAN SECTION    . CHOLECYSTECTOMY    . COLPOSCOPY     age 65  . SINUS SURGERY WITH INSTATRAK    . SINUS SURGERY WITH INSTATRAK    . TONSILLECTOMY      Gynecologic History:  Patient's last menstrual period was 09/19/2019. Last Pap: 4 years ago Results were:  no abnormalities  Last mammogram: 2015 Results were: BI-RAD I  Obstetric History: G2P0011  Family History:  Family History  Problem Relation Age of Onset  . Breast cancer Paternal Aunt   . Pancreatic cancer Paternal Grandmother     Social History:  Social History   Socioeconomic History  . Marital status: Married    Spouse name: Not on file  . Number of children: Not on file  . Years of education: Not  on file  . Highest education level: Not on file  Occupational History  . Not on file  Social Needs  . Financial resource strain: Not on file  . Food insecurity    Worry: Not on file    Inability: Not on file  . Transportation needs    Medical: Not on file    Non-medical: Not on file  Tobacco Use  . Smoking status: Former Research scientist (life sciences)  . Smokeless tobacco: Never Used  Substance and Sexual Activity  . Alcohol use: Yes    Comment: rarely  . Drug use: No  . Sexual activity: Not Currently    Birth control/protection: None  Lifestyle  . Physical activity    Days per week: Not on  file    Minutes per session: Not on file  . Stress: Not on file  Relationships  . Social Herbalist on phone: Not on file    Gets together: Not on file    Attends religious service: Not on file    Active member of club or organization: Not on file    Attends meetings of clubs or organizations: Not on file    Relationship status: Not on file  . Intimate partner violence    Fear of current or ex partner: Not on file    Emotionally abused: Not on file    Physically abused: Not on file    Forced sexual activity: Not on file  Other Topics Concern  . Not on file  Social History Narrative  . Not on file    Allergies:  Allergies  Allergen Reactions  . Venomil Wasp Venom [Wasp Venom] Anaphylaxis  . Ether Other (See Comments)  . Pollen Extract     Environmental allergies      Medications: Prior to Admission medications   Medication Sig Start Date End Date Taking? Authorizing Provider  EPINEPHrine 0.3 mg/0.3 mL IJ SOAJ injection Inject into the muscle.   Yes [provider]  fluticasone (FLOVENT HFA) 110 MCG/ACT inhaler Inhale into the lungs.   Yes [provider]    Physical Exam Vitals: Blood pressure 126/76, pulse 72, height 5\' 8"  (1.727 m), weight (!) 337 lb (152.9 kg), last menstrual period 09/19/2019.  General: NAD HEENT: normocephalic, anicteric Thyroid: no enlargement, no palpable nodules Pulmonary: No increased work of breathing, CTAB Cardiovascular: RRR, distal pulses 2+ Breast: Breast symmetrical, no tenderness, no palpable nodules or masses, no skin or nipple retraction present, no nipple discharge.  No axillary or supraclavicular lymphadenopathy. Abdomen: NABS, soft, non-tender, non-distended.  Umbilicus without lesions.  No hepatomegaly, splenomegaly or masses palpable. No evidence of hernia  Genitourinary:  External: Normal external female genitalia.  Normal urethral meatus, normal Bartholin's and Skene's glands.    Vagina: Normal  vaginal mucosa, no evidence of prolapse.    Cervix: only able to visualize edge of cervix due to body habitus, PAP smear collected semi-blind with confidence in specimen, no CMT  Uterus: deferred for no concerns    Adnexa: deferred for no concerns  Rectal: deferred  Lymphatic: no evidence of inguinal lymphadenopathy Extremities: no edema, erythema, or tenderness Neurologic: Grossly intact Psychiatric: mood appropriate, affect full    Assessment: 48 y.o. G2P0011 routine annual exam  Plan: Problem List Items Addressed This Visit    None    Visit Diagnoses    Well woman exam with routine gynecological exam    -  Primary   Relevant Orders   PAP above 30 HPV rflx 16/18  Cervical cancer screening       Relevant Orders   PAP above 30 HPV rflx 16/18   Screen for colon cancer       Relevant Orders   Ambulatory referral to Gastroenterology      1) Mammogram - recommend yearly screening mammogram.  Mammogram is already scheduled per PCP  2) STI screening  was offered and declined  3) ASCCP guidelines and rationale discussed.  Patient opts for every 3-5 years screening interval  4) Osteoporosis  - per USPTF routine screening DEXA at age 11   Consider FDA-approved medical therapies in postmenopausal women and men aged 67 years and older, based on the following: a) A hip or vertebral (clinical or morphometric) fracture b) T-score ? -2.5 at the femoral neck or spine after appropriate evaluation to exclude secondary causes C) Low bone mass (T-score between -1.0 and -2.5 at the femoral neck or spine) and a 10-year probability of a hip fracture ? 3% or a 10-year probability of a major osteoporosis-related fracture ? 20% based on the [place]-adapted WHO algorithm   5) Routine healthcare maintenance including cholesterol, diabetes screening discussed managed by PCP  6) Colonoscopy: GI referral sent today.  Screening recommended starting at age 12 for average risk individuals, age 80 for  individuals deemed at increased risk (including African Americans) and recommended to continue until age 87.  For patient age 14-85 individualized approach is recommended.  Gold standard screening is via colonoscopy, Cologuard screening is an acceptable alternative for patient unwilling or unable to undergo colonoscopy.  "Colorectal cancer screening for average?risk adults: 2018 guideline update from the [place]: A Cancer Journal for Clinicians: Apr 22, 2017   7) Return in about 1 year (around 10/12/2020) for annual established gyn.   Rod Can, [facility] 10/13/2019, 10:43 AM

## 2019-10-20 LAB — IGP, APTIMA HPV, RFX 16/18,45
HPV Aptima: NEGATIVE
PAP Smear Comment: 0

## 2019-10-25 ENCOUNTER — Encounter: Payer: Self-pay | Admitting: *Deleted

## 2020-01-10 ENCOUNTER — Other Ambulatory Visit: Payer: Self-pay

## 2020-01-10 ENCOUNTER — Ambulatory Visit
Admission: RE | Admit: 2020-01-10 | Discharge: 2020-01-10 | Disposition: A | Discharge status: Home / Self Care | Payer: Managed Care, Other (non HMO) | Source: Ambulatory Visit | Attending: Medical Oncology | Admitting: Medical Oncology

## 2020-01-10 DIAGNOSIS — Z1231 Encounter for screening mammogram for malignant neoplasm of breast: Secondary | ICD-10-CM | POA: Insufficient documentation

## 2020-02-13 ENCOUNTER — Ambulatory Visit: Payer: Managed Care, Other (non HMO) | Attending: Internal Medicine

## 2020-02-13 DIAGNOSIS — Z23 Encounter for immunization: Secondary | ICD-10-CM

## 2020-02-13 NOTE — Progress Notes (Signed)
   Covid-19 Vaccination Clinic  Name:  Brittany Buchanan    MRN: [id number] DOB: 09/07/1971  02/13/2020  [name] was observed post Covid-19 immunization for 15 minutes without incident. She was provided with Vaccine Information Sheet and instruction to access the V-Safe system.   [name] was instructed to call 911 with any severe reactions post vaccine: Marland Kitchen Difficulty breathing  . Swelling of face and throat  . A fast heartbeat  . A bad rash all over body  . Dizziness and weakness   Immunizations Administered    Name Date Dose VIS Date Route   Pfizer COVID-19 Vaccine 02/13/2020 11:48 AM 0.3 mL 11/04/2019 Intramuscular   Manufacturer: [place]   Lot: [id number]   [place]: [id number]

## 2020-03-07 ENCOUNTER — Ambulatory Visit: Payer: Managed Care, Other (non HMO) | Attending: Internal Medicine

## 2020-03-07 DIAGNOSIS — Z23 Encounter for immunization: Secondary | ICD-10-CM

## 2020-03-07 NOTE — Progress Notes (Signed)
   Covid-19 Vaccination Clinic  Name:  Brittany Buchanan    MRN: [id number] DOB: 30-May-1971  03/07/2020  [name] was observed post Covid-19 immunization for 30 minutes based on pre-vaccination screening without incident. She was provided with Vaccine Information Sheet and instruction to access the V-Safe system.   [name] was instructed to call 911 with any severe reactions post vaccine: Marland Kitchen Difficulty breathing  . Swelling of face and throat  . A fast heartbeat  . A bad rash all over body  . Dizziness and weakness   Immunizations Administered    Name Date Dose VIS Date Route   Pfizer COVID-19 Vaccine 03/07/2020 12:55 PM 0.3 mL 11/04/2019 Intramuscular   Manufacturer: [place]   Lot: [id number]   [place]: [id number]

## 2020-08-02 IMAGING — MG DIGITAL SCREENING BILAT W/ TOMO W/ CAD
8 series · 8 of 24 positions shown · non-contrast
Comparison: Previous exam(s).

CLINICAL DATA: Screening.

EXAM:
DIGITAL SCREENING BILATERAL MAMMOGRAM WITH TOMO AND CAD

[R MLO synth-2D]
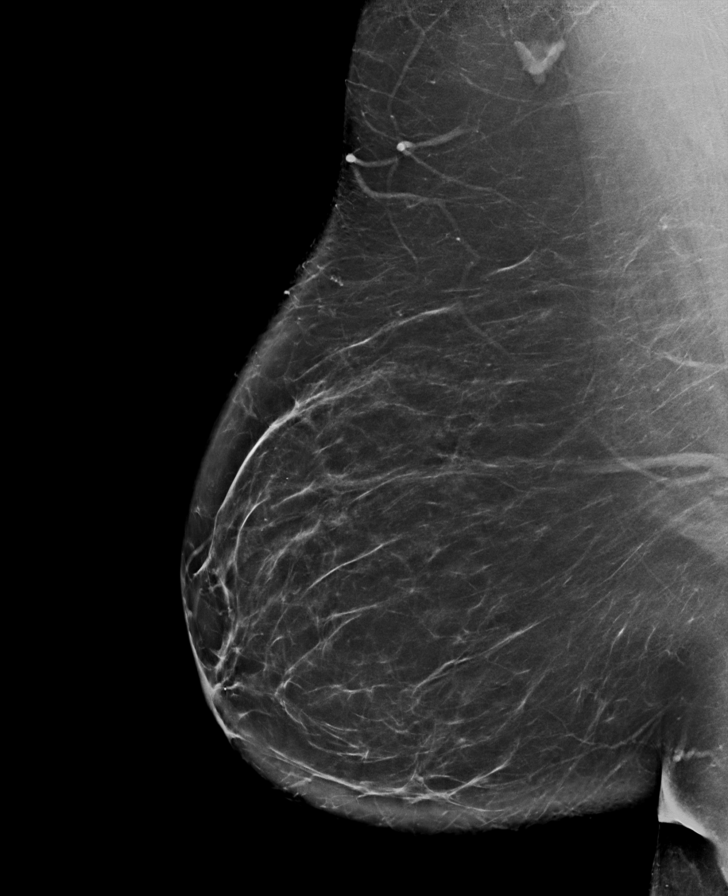

[L CC synth-2D]
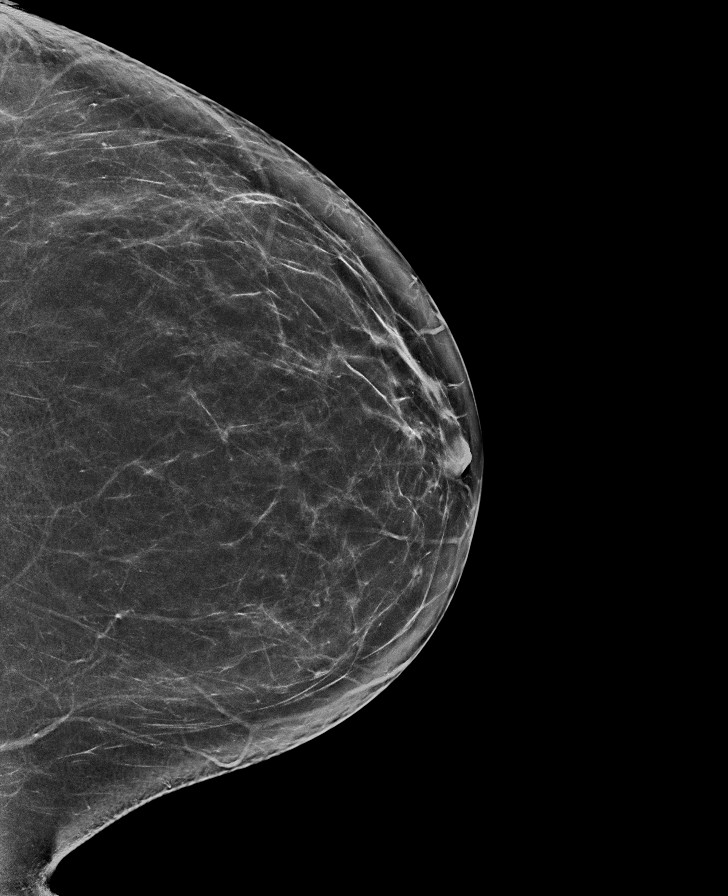

[R CC synth-2D]
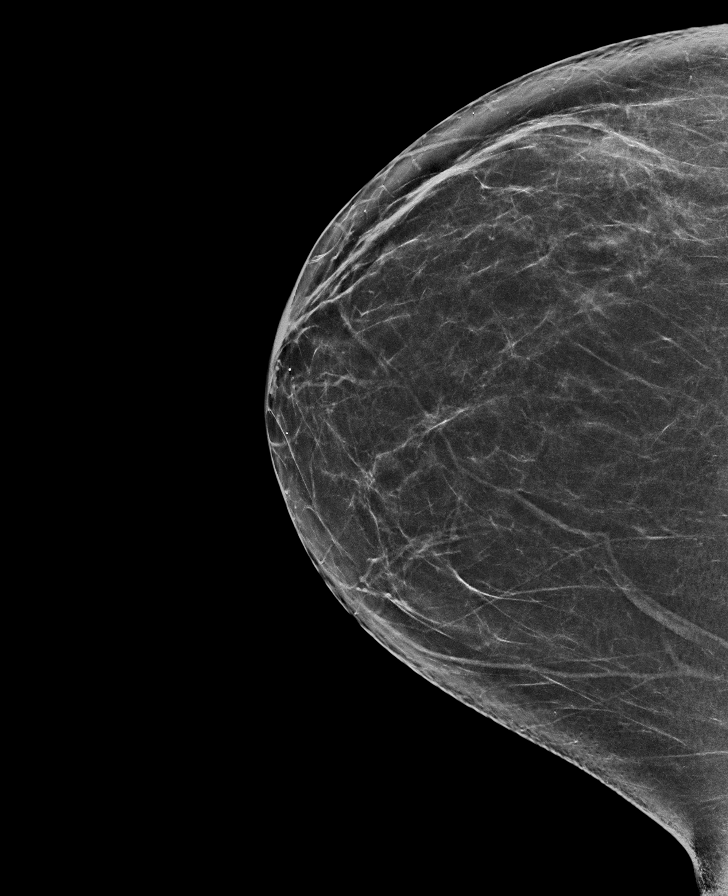

[L MLO synth-2D]
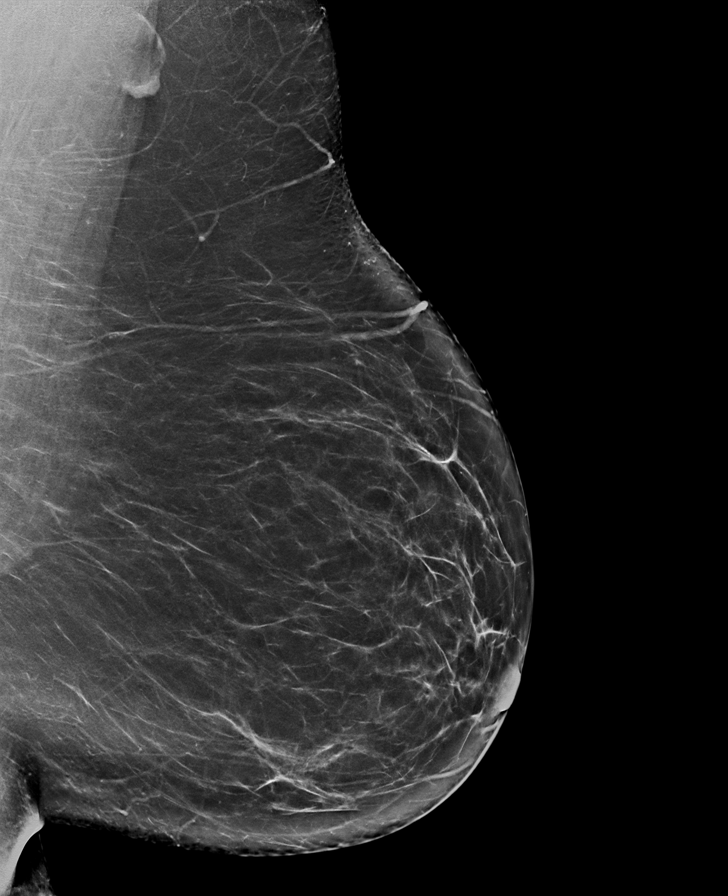

[L MLO tomo · tomo slice 47/93.0]
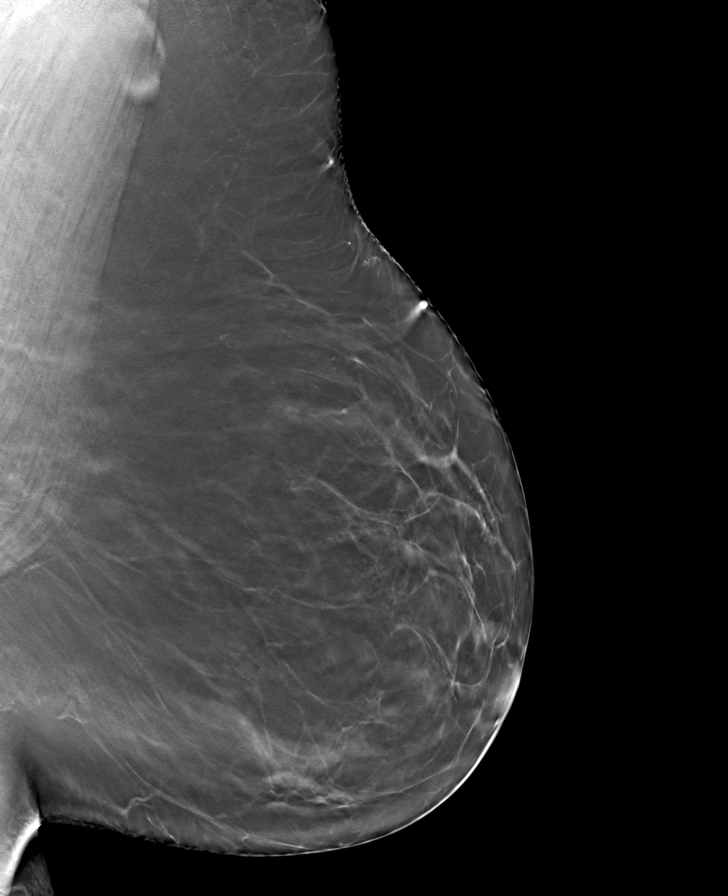

[L CC tomo · tomo slice 41/82.0]
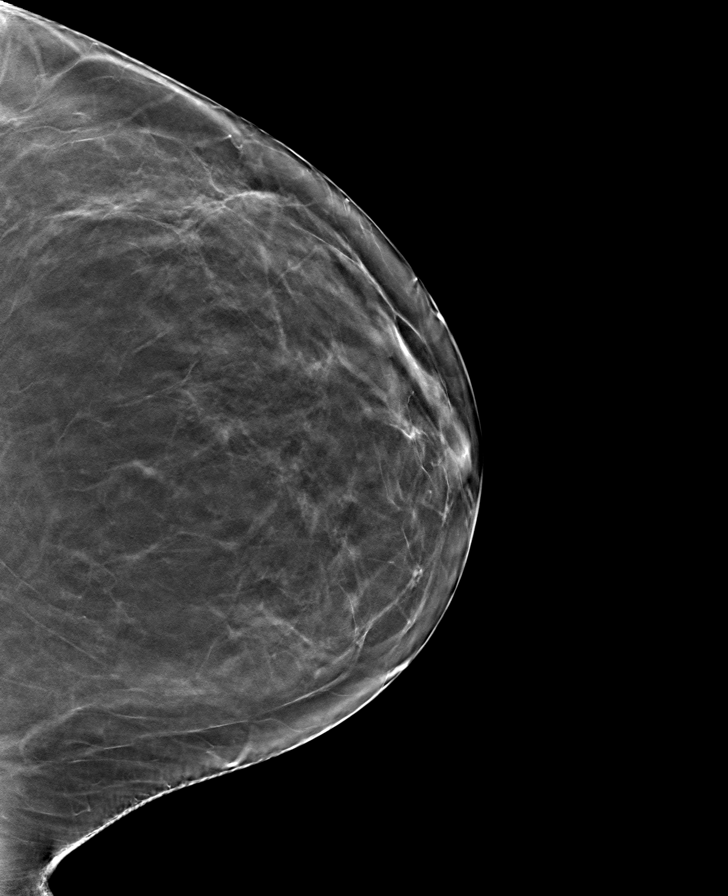

[R MLO tomo · tomo slice 49/97.0]
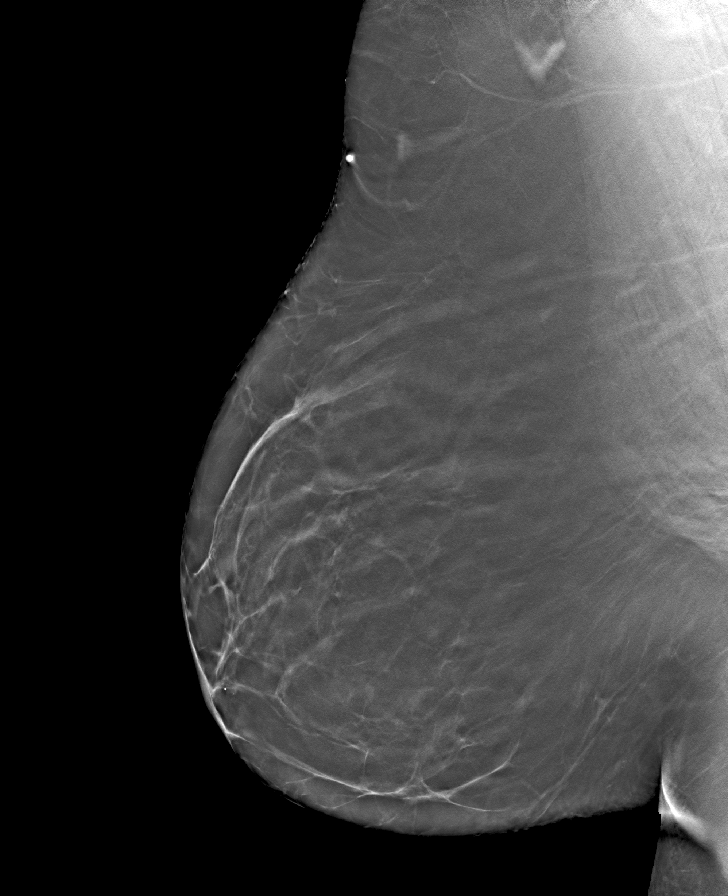

[R CC tomo · tomo slice 38/75.0]
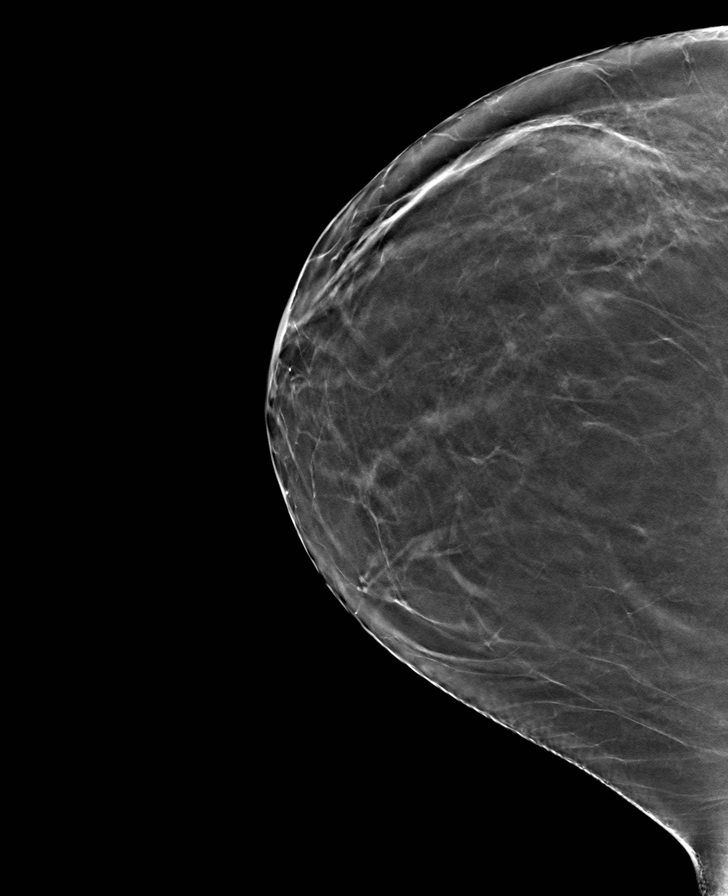

[8 of 24 positions shown; findings below may reference images not displayed]

ACR Breast Density Category b: There are scattered areas of
fibroglandular density.
FINDINGS: There are no findings suspicious for malignancy. Images were
processed with CAD.
IMPRESSION: No mammographic evidence of malignancy. A result letter of this
screening mammogram will be mailed directly to the patient.

RECOMMENDATION:
Screening mammogram in one year. (Code:CN-U-775)

BI-RADS CATEGORY  1: Negative.

## 2020-09-28 ENCOUNTER — Other Ambulatory Visit: Payer: Self-pay

## 2020-09-28 ENCOUNTER — Telehealth (INDEPENDENT_AMBULATORY_CARE_PROVIDER_SITE_OTHER): Payer: Self-pay | Admitting: Gastroenterology

## 2020-09-28 DIAGNOSIS — Z1211 Encounter for screening for malignant neoplasm of colon: Secondary | ICD-10-CM

## 2020-09-28 DIAGNOSIS — Z8371 Family history of colonic polyps: Secondary | ICD-10-CM

## 2020-09-28 DIAGNOSIS — Z83719 Family history of colon polyps, unspecified: Secondary | ICD-10-CM

## 2020-09-28 MED ORDER — NA SULFATE-K SULFATE-MG SULF 17.5-3.13-1.6 GM/177ML PO SOLN: 1.0000 | Freq: Once | ORAL | 0 refills | Status: AC

## 2020-09-28 NOTE — Progress Notes (Signed)
Gastroenterology Pre-Procedure Review  Request Date: Friday 11/02/20 Requesting Physician: Dr. Tahilani  PATIENT REVIEW QUESTIONS: The patient responded to the following health history questions as indicated:    1. Are you having any GI issues? pt thinks she may have a "Vagal Nerve" makes her feel nauseated and has to use the bathroom 2. Do you have a personal history of Polyps? no 3. Do you have a family history of Colon Cancer or Polyps? yes (father has had multiple polyps) 4. Diabetes Mellitus? no 5. Joint replacements in the past 12 months?no 6. Major health problems in the past 3 months?no 7. Any artificial heart valves, MVP, or defibrillator?no    MEDICATIONS & ALLERGIES:    Patient reports the following regarding taking any anticoagulation/antiplatelet therapy:   Plavix, Coumadin, Eliquis, Xarelto, Lovenox, Pradaxa, Brilinta, or Effient? no Aspirin? no  Patient confirms/reports the following medications:  Current Outpatient Medications  Medication Sig Dispense Refill  . EPINEPHrine 0.3 mg/0.3 mL IJ SOAJ injection Inject into the muscle.    . fluticasone (FLOVENT HFA) 110 MCG/ACT inhaler Inhale into the lungs.    . Na Sulfate-K Sulfate-Mg Sulf 17.5-3.13-1.6 GM/177ML SOLN Take 1 kit by mouth once for 1 dose. 354 mL 0   No current facility-administered medications for this visit.    Patient confirms/reports the following allergies:  Allergies  Allergen Reactions  . Venomil Wasp Venom [Wasp Venom] Anaphylaxis  . Ether Other (See Comments)  . Pollen Extract     Environmental allergies      No orders of the defined types were placed in this encounter.   AUTHORIZATION INFORMATION Primary Insurance: 1D#: Group #:  Secondary Insurance: 1D#: Group #:  SCHEDULE INFORMATION: Date: Friday 11/02/20 Time: Location:[facility] 

## 2020-10-31 ENCOUNTER — Other Ambulatory Visit: Payer: Self-pay

## 2020-10-31 ENCOUNTER — Other Ambulatory Visit
Admission: RE | Admit: 2020-10-31 | Discharge: 2020-10-31 | Disposition: A | Discharge status: Home / Self Care | Payer: Managed Care, Other (non HMO) | Source: Ambulatory Visit | Attending: Gastroenterology | Admitting: Gastroenterology

## 2020-10-31 DIAGNOSIS — Z01812 Encounter for preprocedural laboratory examination: Secondary | ICD-10-CM | POA: Insufficient documentation

## 2020-10-31 DIAGNOSIS — Z20822 Contact with and (suspected) exposure to covid-19: Secondary | ICD-10-CM | POA: Diagnosis not present

## 2020-10-31 LAB — SARS CORONAVIRUS 2 (TAT 6-24 HRS): SARS Coronavirus 2: NEGATIVE

## 2020-11-02 ENCOUNTER — Other Ambulatory Visit: Payer: Self-pay

## 2020-11-02 ENCOUNTER — Ambulatory Visit
Admission: RE | Admit: 2020-11-02 | Discharge: 2020-11-02 | Disposition: A | Discharge status: Home / Self Care | Payer: Managed Care, Other (non HMO) | Attending: Gastroenterology | Admitting: Gastroenterology

## 2020-11-02 ENCOUNTER — Ambulatory Visit: Payer: Managed Care, Other (non HMO) | Admitting: Certified Registered Nurse Anesthetist

## 2020-11-02 ENCOUNTER — Encounter
Admission: RE | Disposition: A | Discharge status: Home / Self Care | Payer: Self-pay | Source: Home / Self Care | Attending: Gastroenterology

## 2020-11-02 ENCOUNTER — Encounter: Payer: Self-pay | Admitting: Gastroenterology

## 2020-11-02 DIAGNOSIS — D124 Benign neoplasm of descending colon: Secondary | ICD-10-CM | POA: Diagnosis not present

## 2020-11-02 DIAGNOSIS — Z87891 Personal history of nicotine dependence: Secondary | ICD-10-CM | POA: Diagnosis not present

## 2020-11-02 DIAGNOSIS — D123 Benign neoplasm of transverse colon: Secondary | ICD-10-CM | POA: Diagnosis not present

## 2020-11-02 DIAGNOSIS — K635 Polyp of colon: Secondary | ICD-10-CM | POA: Diagnosis not present

## 2020-11-02 DIAGNOSIS — Z8371 Family history of colonic polyps: Secondary | ICD-10-CM

## 2020-11-02 DIAGNOSIS — Z1211 Encounter for screening for malignant neoplasm of colon: Secondary | ICD-10-CM

## 2020-11-02 HISTORY — PX: COLONOSCOPY WITH PROPOFOL: SHX5780

## 2020-11-02 HISTORY — DX: Other complications of anesthesia, initial encounter: T88.59XA

## 2020-11-02 SURGERY — COLONOSCOPY WITH PROPOFOL
Anesthesia: General

## 2020-11-02 MED ORDER — PROPOFOL 500 MG/50ML IV EMUL
INTRAVENOUS | Status: DC | PRN
  Administered 2020-11-02: 140 ug/kg/min via INTRAVENOUS

## 2020-11-02 MED ORDER — SODIUM CHLORIDE 0.9 % IV SOLN
INTRAVENOUS | Status: DC
  Administered 2020-11-02: 1000 mL via INTRAVENOUS

## 2020-11-02 MED ORDER — PROPOFOL 10 MG/ML IV BOLUS
INTRAVENOUS | Status: DC | PRN
  Administered 2020-11-02 (×2): 50 mg via INTRAVENOUS
  Administered 2020-11-02: 100 mg via INTRAVENOUS

## 2020-11-02 NOTE — Transfer of Care (Signed)
Immediate Anesthesia Transfer of Care Note  Patient: Brittany Buchanan  Procedure(s) Performed: COLONOSCOPY WITH PROPOFOL (N/A )  Patient Location: Endoscopy Unit  Anesthesia Type:General  Level of Consciousness: awake  Airway & Oxygen Therapy: Patient Spontanous Breathing  Post-op Assessment: Report given to RN and Post -op Vital signs reviewed and stable  Post vital signs: Reviewed and stable  Last Vitals:  Vitals Value Taken Time  BP 116/64 11/02/20 1028  Temp    Pulse 86 11/02/20 1028  Resp 21 11/02/20 1028  SpO2 100 % 11/02/20 1028  Vitals shown include unvalidated device data.  Last Pain:  Vitals:   11/02/20 0859  TempSrc: Temporal  PainSc: 0-No pain         Complications: No complications documented.

## 2020-11-02 NOTE — Anesthesia Preprocedure Evaluation (Addendum)
Anesthesia Evaluation  Patient identified by MRN, date of birth, ID band Patient awake    Reviewed: Allergy & Precautions, H&P , NPO status , Patient's Chart, lab work & pertinent test results, reviewed documented beta blocker date and time   History of Anesthesia Complications (+) history of anesthetic complications  Airway Mallampati: III  TM Distance: >3 FB Neck ROM: full    Dental  (+) Dental Advidsory Given, Caps, Teeth Intact   Pulmonary neg shortness of breath, asthma , neg sleep apnea, neg COPD, neg recent URI, former smoker,    Pulmonary exam normal breath sounds clear to auscultation       Cardiovascular Exercise Tolerance: Good negative cardio ROS Normal cardiovascular exam Rhythm:regular Rate:Normal     Neuro/Psych negative neurological ROS  negative psych ROS   GI/Hepatic negative GI ROS, Neg liver ROS,   Endo/Other  neg diabetesMorbid obesity  Renal/GU negative Renal ROS  negative genitourinary   Musculoskeletal   Abdominal   Peds  Hematology negative hematology ROS (+)   Anesthesia Other Findings Past Medical History: No date: Abnormal Pap smear of cervix     Comment:  Age 49 No date: Asthma No date: Complication of anesthesia No date: Multiple allergies     Comment:  pets, mold, pollen, dust mites etc.   Reproductive/Obstetrics negative OB ROS                             Anesthesia Physical Anesthesia Plan  ASA: III  Anesthesia Plan: General   Post-op Pain Management:    Induction: Intravenous  PONV Risk Score and Plan: 3 and Propofol infusion and TIVA  Airway Management Planned: Natural Airway and Nasal Cannula  Additional Equipment:   Intra-op Plan:   Post-operative Plan:   Informed Consent: I have reviewed the patients History and Physical, chart, labs and discussed the procedure including the risks, benefits and alternatives for the proposed  anesthesia with the patient or authorized representative who has indicated his/her understanding and acceptance.     Dental Advisory Given  Plan Discussed with: Anesthesiologist, CRNA and Surgeon  Anesthesia Plan Comments: (Patient consented for risks of anesthesia including but not limited to:  - adverse reactions to medications - risk of airway placement if required - damage to eyes, teeth, lips or other oral mucosa - nerve damage due to positioning  - sore throat or hoarseness - Damage to heart, brain, nerves, lungs, other parts of body or loss of life  Patient voiced understanding.)       Anesthesia Quick Evaluation

## 2020-11-02 NOTE — Anesthesia Postprocedure Evaluation (Signed)
Anesthesia Post Note  Patient: Edana Aguado [id number]  Procedure(s) Performed: COLONOSCOPY WITH PROPOFOL (N/A )  Patient location during evaluation: Endoscopy Anesthesia Type: General Level of consciousness: awake and alert Pain management: pain level controlled Vital Signs Assessment: post-procedure vital signs reviewed and stable Respiratory status: spontaneous breathing, nonlabored ventilation, respiratory function stable and patient connected to nasal cannula oxygen Cardiovascular status: blood pressure returned to baseline and stable Postop Assessment: no apparent nausea or vomiting Anesthetic complications: no   No complications documented.   Last Vitals:  Vitals:   11/02/20 1028 11/02/20 1038  BP: 116/64 (!) 131/91  Pulse: 81   Resp:    Temp: (!) 36 C   SpO2: 100%     Last Pain:  Vitals:   11/02/20 1058  TempSrc:   PainSc: 0-No pain                 Precious Haws Finneas Mathe

## 2020-11-02 NOTE — H&P (Signed)
Vonda Antigua, MD [address], [place], [place], [place], [address] [address], [place], [place], [place], [address] Phone: [phone]  Fax: [phone]  Primary Care Physician:  Hughie Closs, PA-C   Pre-Procedure History & Physical: HPI:  Brittany Buchanan is a 49 y.o. adult is here for a colonoscopy.   Past Medical History:  Diagnosis Date  . Abnormal Pap smear of cervix    Age 66  . Asthma   . Complication of anesthesia   . Multiple allergies    pets, mold, pollen, dust mites etc.    Past Surgical History:  Procedure Laterality Date  . CESAREAN SECTION    . CHOLECYSTECTOMY    . COLPOSCOPY     age 32  . SINUS SURGERY WITH INSTATRAK    . SINUS SURGERY WITH INSTATRAK    . TONSILLECTOMY      Prior to Admission medications   Medication Sig Start Date End Date Taking? Authorizing Provider  fluticasone (FLOVENT DISKUS) 50 MCG/BLIST diskus inhaler Inhale 1 puff into the lungs 2 (two) times daily.   Yes [provider]  EPINEPHrine 0.3 mg/0.3 mL IJ SOAJ injection Inject into the muscle.    [provider]  fluticasone (FLOVENT HFA) 110 MCG/ACT inhaler Inhale into the lungs.    [provider]    Allergies as of 09/28/2020 - Review Complete 09/28/2020  Allergen Reaction Noted  . Venomil wasp venom [wasp venom] Anaphylaxis 05/02/2015  . Ether Other (See Comments) 05/02/2015  . Pollen extract  10/10/2015    Family History  Problem Relation Age of Onset  . Breast cancer Paternal Aunt   . Pancreatic cancer Paternal Grandmother     Social History   Socioeconomic History  . Marital status: Married    Spouse name: Not on file  . Number of children: Not on file  . Years of education: Not on file  . Highest education level: Not on file  Occupational History  . Not on file  Tobacco Use  . Smoking status: Former Research scientist (life sciences)  . Smokeless tobacco: Never Used  Vaping Use  . Vaping Use: Never used  Substance and Sexual  Activity  . Alcohol use: Yes    Comment: rarely  . Drug use: No  . Sexual activity: Not Currently    Birth control/protection: None  Other Topics Concern  . Not on file  Social History Narrative  . Not on file   Social Determinants of Health   Financial Resource Strain: Not on file  Food Insecurity: Not on file  Transportation Needs: Not on file  Physical Activity: Not on file  Stress: Not on file  Social Connections: Not on file  Intimate Partner Violence: Not on file    Review of Systems: See HPI, otherwise negative ROS  Physical Exam: BP (!) 141/103   Pulse (!) 101   Temp (!) 96.8 F (36 C) (Temporal)   Resp 18   Ht 5\' 9"  (1.753 m)   Wt (!) 150 kg   LMP 09/19/2019   SpO2 99%   BMI 48.83 kg/m  General:   Alert,  pleasant and cooperative in NAD Head:  Normocephalic and atraumatic. Neck:  Supple; no masses or thyromegaly. Lungs:  Clear throughout to auscultation, normal respiratory effort.    Heart:  +S1, +S2, Regular rate and rhythm, No edema. Abdomen:  Soft, nontender and nondistended. Normal bowel sounds, without guarding, and without rebound.   Neurologic:  Alert and  oriented x4;  grossly normal neurologically.  Impression/Plan: Brittany Buchanan is here for a colonoscopy to be performed for average risk screening.  Risks, benefits, limitations, and alternatives regarding  colonoscopy have been reviewed with the patient.  Questions have been answered.  All parties agreeable.   Virgel Manifold, MD  11/02/2020, 9:40 AM

## 2020-11-02 NOTE — Op Note (Signed)
[facility] Gastroenterology Patient Name: Brittany Buchanan Procedure Date: 11/02/2020 9:52 AM MRN: [id number] Account #: [id number] Date of Birth: Sep 05, 1971 Admit Type: Outpatient Age: 49 Room: [facility] ENDO ROOM 3 Gender: Female Note Status: Finalized Procedure:             Colonoscopy Indications:           Screening for colorectal malignant neoplasm Providers:             Luvern Mischke B. Bonna Gains MD, MD Referring MD:          Forest Gleason Md, MD (Referring MD) Medicines:             Monitored Anesthesia Care Complications:         No immediate complications. Procedure:             Pre-Anesthesia Assessment:                        - ASA Grade Assessment: II - A patient with mild                         systemic disease.                        - Prior to the procedure, a History and Physical was                         performed, and patient medications, allergies and                         sensitivities were reviewed. The patient's tolerance                         of previous anesthesia was reviewed.                        - The risks and benefits of the procedure and the                         sedation options and risks were discussed with the                         patient. All questions were answered and informed                         consent was obtained.                        - Patient identification and proposed procedure were                         verified prior to the procedure by the physician, the                         nurse, the anesthesiologist, the anesthetist and the                         technician. The procedure was verified in the                         procedure  room.                        After obtaining informed consent, the colonoscope was                         passed under direct vision. Throughout the procedure,                         the patient's blood pressure, pulse, and oxygen                         saturations were  monitored continuously. The                         Colonoscope was introduced through the anus and                         advanced to the the cecum, identified by appendiceal                         orifice and ileocecal valve. The colonoscopy was                         performed with ease. The patient tolerated the                         procedure well. The quality of the bowel preparation                         was good. Findings:      The perianal and digital rectal examinations were normal.      Two sessile polyps were found in the descending colon and transverse       colon. The polyps were 4 to 6 mm in size. These polyps were removed with       a cold snare. Resection and retrieval were complete.      The exam was otherwise without abnormality.      The rectum, sigmoid colon, descending colon, transverse colon, ascending       colon and cecum appeared normal.      The retroflexed view of the distal rectum and anal verge was normal and       showed no anal or rectal abnormalities. Impression:            - Two 4 to 6 mm polyps in the descending colon and in                         the transverse colon, removed with a cold snare.                         Resected and retrieved.                        - The examination was otherwise normal.                        - The rectum, sigmoid colon, descending colon,  transverse colon, ascending colon and cecum are normal.                        - The distal rectum and anal verge are normal on                         retroflexion view. Recommendation:        - Discharge patient to home (with escort).                        - Advance diet as tolerated.                        - Continue present medications.                        - Await pathology results.                        - Repeat colonoscopy date to be determined after                         pending pathology results are reviewed.                        -  The findings and recommendations were discussed with                         the patient.                        - The findings and recommendations were discussed with                         the patient's family.                        - Return to primary care physician as previously                         scheduled. Procedure Code(s):     --- Professional ---                        [phone], Colonoscopy, flexible; with removal of                         tumor(s), polyp(s), or other lesion(s) by snare                         technique Diagnosis Code(s):     --- Professional ---                        Z12.11, Encounter for screening for malignant neoplasm                         of colon                        K63.5, Polyp of colon CPT copyright 2019 [employer]. All rights reserved. The codes documented in this report are preliminary and  upon coder review may  be revised to meet current compliance requirements.  Vonda Antigua, MD Margretta Sidle B. Bonna Gains MD, MD 11/02/2020 10:26:45 AM This report has been signed electronically. Number of Addenda: 0 Note Initiated On: 11/02/2020 9:52 AM Scope Withdrawal Time: 0 hours 14 minutes 19 seconds  Total Procedure Duration: 0 hours 17 minutes 33 seconds  Estimated Blood Loss:  Estimated blood loss: none.      [facility]

## 2020-11-05 ENCOUNTER — Encounter: Payer: Self-pay | Admitting: Gastroenterology

## 2020-11-05 LAB — SURGICAL PATHOLOGY

## 2020-11-07 ENCOUNTER — Telehealth: Payer: Self-pay | Admitting: Gastroenterology

## 2020-11-07 NOTE — Telephone Encounter (Signed)
Called patient but had to leave her a detailed message letting her know that her colonoscopy was good and that she will be put in our recall list and that we will call her back in 7 years to schedule her next colonoscopy. Patient was told to call us back if she had further questions.

## 2020-11-07 NOTE — Telephone Encounter (Signed)
Pt calling asking for results

## 2021-02-07 ENCOUNTER — Telehealth: Payer: Self-pay | Admitting: Gastroenterology

## 2021-02-07 ENCOUNTER — Other Ambulatory Visit: Payer: Self-pay | Admitting: Family Medicine

## 2021-02-07 DIAGNOSIS — Z1231 Encounter for screening mammogram for malignant neoplasm of breast: Secondary | ICD-10-CM

## 2021-02-07 NOTE — Telephone Encounter (Signed)
Called patient back but had to leave her a detailed message again letting her know that her colonoscopy was normal and that she will need a repeat colonoscopy in 7 years but other than that, everything is good.

## 2021-02-07 NOTE — Telephone Encounter (Signed)
Please call to answer questions about her last colonoscopy procedure.

## 2021-02-12 ENCOUNTER — Ambulatory Visit
Admission: RE | Admit: 2021-02-12 | Discharge: 2021-02-12 | Disposition: A | Discharge status: Home / Self Care | Payer: Managed Care, Other (non HMO) | Source: Ambulatory Visit | Attending: Family Medicine | Admitting: Family Medicine

## 2021-02-12 ENCOUNTER — Other Ambulatory Visit: Payer: Self-pay

## 2021-02-12 DIAGNOSIS — Z1231 Encounter for screening mammogram for malignant neoplasm of breast: Secondary | ICD-10-CM | POA: Insufficient documentation

## 2022-07-08 ENCOUNTER — Other Ambulatory Visit: Payer: Self-pay | Admitting: Family Medicine

## 2022-07-08 DIAGNOSIS — Z1231 Encounter for screening mammogram for malignant neoplasm of breast: Secondary | ICD-10-CM

## 2022-07-31 ENCOUNTER — Ambulatory Visit
Admission: RE | Admit: 2022-07-31 | Discharge: 2022-07-31 | Disposition: A | Discharge status: Home / Self Care | Payer: Managed Care, Other (non HMO) | Source: Ambulatory Visit | Attending: Family Medicine | Admitting: Family Medicine

## 2022-07-31 DIAGNOSIS — Z1231 Encounter for screening mammogram for malignant neoplasm of breast: Secondary | ICD-10-CM | POA: Diagnosis present
